# Patient Record
Sex: Female | Born: 1935 | Race: White | Marital: Single
Health system: Southern US, Community
[De-identification: ages and names within clinical notes are randomized; demographics above are authoritative.]

## PROBLEM LIST (undated history)

## (undated) HISTORY — PX: BREAST EXCISIONAL BIOPSY: SUR124

---

## 2017-02-22 ENCOUNTER — Other Ambulatory Visit: Payer: Self-pay | Admitting: Neurosurgery

## 2017-02-22 DIAGNOSIS — S12100D Unspecified displaced fracture of second cervical vertebra, subsequent encounter for fracture with routine healing: Secondary | ICD-10-CM

## 2017-02-28 ENCOUNTER — Ambulatory Visit
Admission: RE | Admit: 2017-02-28 | Discharge: 2017-02-28 | Disposition: A | Discharge status: Home / Self Care | Payer: Medicare Other | Source: Ambulatory Visit | Attending: Neurosurgery | Admitting: Neurosurgery

## 2017-02-28 DIAGNOSIS — S12100D Unspecified displaced fracture of second cervical vertebra, subsequent encounter for fracture with routine healing: Secondary | ICD-10-CM

## 2018-06-02 ENCOUNTER — Other Ambulatory Visit: Payer: Self-pay | Admitting: Family Medicine

## 2018-06-02 DIAGNOSIS — N6310 Unspecified lump in the right breast, unspecified quadrant: Secondary | ICD-10-CM

## 2018-06-09 ENCOUNTER — Other Ambulatory Visit: Payer: Medicare Other

## 2018-06-10 ENCOUNTER — Ambulatory Visit
Admission: RE | Admit: 2018-06-10 | Discharge: 2018-06-10 | Disposition: A | Discharge status: Home / Self Care | Payer: Medicare Other | Source: Ambulatory Visit | Attending: Family Medicine | Admitting: Family Medicine

## 2018-06-10 ENCOUNTER — Other Ambulatory Visit: Payer: Self-pay | Admitting: Family Medicine

## 2018-06-10 DIAGNOSIS — N6310 Unspecified lump in the right breast, unspecified quadrant: Secondary | ICD-10-CM

## 2018-06-13 ENCOUNTER — Other Ambulatory Visit: Payer: Self-pay | Admitting: Family Medicine

## 2018-06-13 DIAGNOSIS — N6310 Unspecified lump in the right breast, unspecified quadrant: Secondary | ICD-10-CM

## 2018-06-16 ENCOUNTER — Other Ambulatory Visit: Payer: Medicare Other

## 2018-06-17 ENCOUNTER — Inpatient Hospital Stay
Admission: RE | Admit: 2018-06-17 | Discharge: 2018-06-17 | Disposition: A | Discharge status: Home / Self Care | Payer: Medicare Other | Source: Ambulatory Visit | Attending: Family Medicine | Admitting: Family Medicine

## 2018-08-11 ENCOUNTER — Other Ambulatory Visit: Payer: Self-pay | Admitting: Geriatric Medicine

## 2018-08-11 DIAGNOSIS — N6310 Unspecified lump in the right breast, unspecified quadrant: Secondary | ICD-10-CM

## 2018-08-19 ENCOUNTER — Ambulatory Visit
Admission: RE | Admit: 2018-08-19 | Discharge: 2018-08-19 | Disposition: A | Discharge status: Home / Self Care | Payer: Medicare Other | Source: Ambulatory Visit | Attending: Family Medicine | Admitting: Family Medicine

## 2018-08-19 ENCOUNTER — Ambulatory Visit
Admission: RE | Admit: 2018-08-19 | Discharge: 2018-08-19 | Disposition: A | Discharge status: Home / Self Care | Payer: Medicare Other | Source: Ambulatory Visit | Attending: Geriatric Medicine | Admitting: Geriatric Medicine

## 2018-08-19 DIAGNOSIS — N6310 Unspecified lump in the right breast, unspecified quadrant: Secondary | ICD-10-CM

## 2018-09-11 ENCOUNTER — Inpatient Hospital Stay: Payer: Medicare Other | Attending: Hematology and Oncology | Admitting: Hematology and Oncology

## 2018-09-11 ENCOUNTER — Telehealth: Payer: Self-pay | Admitting: Hematology and Oncology

## 2018-09-11 DIAGNOSIS — E559 Vitamin D deficiency, unspecified: Secondary | ICD-10-CM | POA: Insufficient documentation

## 2018-09-11 DIAGNOSIS — K219 Gastro-esophageal reflux disease without esophagitis: Secondary | ICD-10-CM | POA: Insufficient documentation

## 2018-09-11 DIAGNOSIS — N183 Chronic kidney disease, stage 3 unspecified: Secondary | ICD-10-CM

## 2018-09-11 DIAGNOSIS — Z8673 Personal history of transient ischemic attack (TIA), and cerebral infarction without residual deficits: Secondary | ICD-10-CM | POA: Diagnosis not present

## 2018-09-11 DIAGNOSIS — M542 Cervicalgia: Secondary | ICD-10-CM

## 2018-09-11 DIAGNOSIS — N3281 Overactive bladder: Secondary | ICD-10-CM | POA: Insufficient documentation

## 2018-09-11 DIAGNOSIS — Z79811 Long term (current) use of aromatase inhibitors: Secondary | ICD-10-CM | POA: Diagnosis not present

## 2018-09-11 DIAGNOSIS — Z79899 Other long term (current) drug therapy: Secondary | ICD-10-CM | POA: Diagnosis not present

## 2018-09-11 DIAGNOSIS — F329 Major depressive disorder, single episode, unspecified: Secondary | ICD-10-CM | POA: Diagnosis not present

## 2018-09-11 DIAGNOSIS — C50511 Malignant neoplasm of lower-outer quadrant of right female breast: Secondary | ICD-10-CM | POA: Insufficient documentation

## 2018-09-11 DIAGNOSIS — F411 Generalized anxiety disorder: Secondary | ICD-10-CM

## 2018-09-11 DIAGNOSIS — F330 Major depressive disorder, recurrent, mild: Secondary | ICD-10-CM

## 2018-09-11 DIAGNOSIS — I10 Essential (primary) hypertension: Secondary | ICD-10-CM | POA: Diagnosis not present

## 2018-09-11 DIAGNOSIS — F339 Major depressive disorder, recurrent, unspecified: Secondary | ICD-10-CM | POA: Insufficient documentation

## 2018-09-11 DIAGNOSIS — I739 Peripheral vascular disease, unspecified: Secondary | ICD-10-CM

## 2018-09-11 DIAGNOSIS — Z7982 Long term (current) use of aspirin: Secondary | ICD-10-CM | POA: Diagnosis not present

## 2018-09-11 DIAGNOSIS — R269 Unspecified abnormalities of gait and mobility: Secondary | ICD-10-CM

## 2018-09-11 DIAGNOSIS — K829 Disease of gallbladder, unspecified: Secondary | ICD-10-CM

## 2018-09-11 DIAGNOSIS — Z170 Estrogen receptor positive status [ER+]: Secondary | ICD-10-CM | POA: Diagnosis not present

## 2018-09-11 MED ORDER — LETROZOLE 2.5 MG PO TABS: 2.5000 mg | ORAL_TABLET | Freq: Every day | ORAL | 3 refills | Status: DC

## 2018-09-11 NOTE — Progress Notes (Signed)
[facility] [place] NOTE  Patient Care Team: Raymondo Band, MD as PCP - General (Family Medicine)  CHIEF COMPLAINTS/PURPOSE OF CONSULTATION:  Newly diagnosed breast cancer  HISTORY OF PRESENTING ILLNESS:  Katelyn Sims 84 y.o. female is here because of recent diagnosis of right breast cancer.  Patient is suspicious mass in the right breast which was evaluated by mammogram and ultrasound.  The ultrasound revealed a 2.9 cm mass in the right breast that was ER PR positive HER-2 negative.  She was seen by surgery and was referred to [place] for multidisciplinary care and management.  Patient saw surgery who did not recommend lumpectomy given her age and comorbidities.  She was referred to [place] for discussion regarding palliative antiestrogen treatment options.  She is accompanied today by her son.  She is extremely hard of hearing and uses a wheelchair for ambulation.  She lives in an assisted living facility.  Her son is her primary caregiver.  I reviewed her records extensively and collaborated the history with the patient.  SUMMARY OF ONCOLOGIC HISTORY:   Malignant neoplasm of lower-outer quadrant of right breast of female, estrogen receptor positive ([place])   08/19/2018 Initial Diagnosis    Suspicious mass at 8:30 position right breast 2.9 cm, ultrasound-guided biopsy revealed grade 2-3 IDC, ER 95%, PR 95%, Ki-67 10%, HER-2 -1+, T2 NX stage IIa clinical stage    09/11/2018 Cancer Staging    Staging form: Breast, AJCC 8th Edition - Clinical: Stage IIA (cT2, cN0, cM0, G3, ER+, PR+, HER2-) - Signed by Nicholas Lose, MD on 09/11/2018      MEDICAL HISTORY:  Hypertension, CKD stage III, generalized anxiety disorder, major depression, overactive bladder, vitamin D deficiency, GERD, gallbladder disease, stroke, peripheral vascular disease SURGICAL HISTORY:Revision of knee joint SOCIAL HISTORY: FAMILY HISTORY: Mother deceased due to CHF and DVT no family history of  cancers  ALLERGIES:  has no allergies on file.  MEDICATIONS:  Current Outpatient Medications  Medication Sig Dispense Refill  . acetaminophen (TYLENOL) 325 MG tablet Take 650 mg by mouth every 6 (six) hours as needed.    Marland Kitchen aspirin 81 MG tablet Take 81 mg by mouth daily.    . calcium carbonate (TUMS - DOSED IN MG ELEMENTAL CALCIUM) 500 MG chewable tablet Chew 1 tablet by mouth 3 (three) times daily with meals.    . carboxymethylcellulose (REFRESH TEARS) 0.5 % SOLN Place 1 drop into both eyes 3 (three) times daily.    . ciprofloxacin (CIPRO) 500 MG tablet Take 500 mg by mouth every 12 (twelve) hours.    Marland Kitchen guaiFENesin (MUCINEX) 600 MG 12 hr tablet Take 600 mg by mouth 2 (two) times daily. X 7 days    . levothyroxine (SYNTHROID, LEVOTHROID) 88 MCG tablet Take 88 mcg by mouth daily before breakfast.    . loperamide (IMODIUM A-D) 2 MG tablet Take 2 mg by mouth 4 (four) times daily as needed for diarrhea or loose stools.    Marland Kitchen losartan-hydrochlorothiazide (HYZAAR) 50-12.5 MG tablet Take 2 tablets by mouth daily.    . Olopatadine HCl (PATADAY) 0.2 % SOLN Apply 2 drops to eye daily. Left Eye   X 10 days    . oxybutynin (DITROPAN) 5 MG tablet Take 5 mg by mouth 2 (two) times daily.    Marland Kitchen PARoxetine (PAXIL) 20 MG tablet Take 20 mg by mouth daily.    Marland Kitchen letrozole (FEMARA) 2.5 MG tablet Take 1 tablet (2.5 mg total) by mouth daily. 90 tablet  3   No current facility-administered medications for this visit.     REVIEW OF SYSTEMS:   Constitutional: Denies fevers, chills or abnormal night sweats Eyes: Denies blurriness of vision, double vision or watery eyes Ears, nose, mouth, throat, and face: Denies mucositis or sore throat Respiratory: Denies cough, dyspnea or wheezes Cardiovascular: Denies palpitation, chest discomfort or lower extremity swelling Gastrointestinal:  Denies nausea, heartburn or change in bowel habits Skin: Denies abnormal skin rashes Lymphatics: Denies new lymphadenopathy or easy  bruising Neurological:Denies numbness, tingling or new weaknesses Behavioral/Psych: Mood is stable, no new changes  Breast: Right breast lump All other systems were reviewed with the patient and are negative.  PHYSICAL EXAMINATION: ECOG PERFORMANCE STATUS: 1 - Symptomatic but completely ambulatory  Vitals:   09/11/18 1304  BP: 106/89  Pulse: 78  Resp: 18  Temp: (!) 97.3 F (36.3 C)  SpO2: 91%   Filed Weights    GENERAL:alert, no distress and comfortable SKIN: skin color, texture, turgor are normal, no rashes or significant lesions EYES: normal, conjunctiva are pink and non-injected, sclera clear OROPHARYNX:no exudate, no erythema and lips, buccal mucosa, and tongue normal  NECK: supple, thyroid normal size, non-tender, without nodularity LYMPH:  no palpable lymphadenopathy in the cervical, axillary or inguinal LUNGS: clear to auscultation and percussion with normal breathing effort HEART: regular rate & rhythm and no murmurs and no lower extremity edema ABDOMEN:abdomen soft, non-tender and normal bowel sounds Musculoskeletal:no cyanosis of digits and no clubbing  PSYCH: alert & oriented x 3 with fluent speech NEURO: no focal motor/sensory deficits BREAST: Palpable lump in the right breast. No palpable axillary or supraclavicular lymphadenopathy (exam performed in the presence of a chaperone)  Yes-man we may good enough calling to check on the status of her claim I am not able to do that all it says my account is blocked and unable to know if it has been approved by Legrand Como to get my phone and swollen shut I am sorry repeat that repeat that last part short just did not hear you say I should pay for the shipping of this fall or we will give was a paid shipment box OKAY perfect okay DM anticipated arrival okay sure RADIOGRAPHIC STUDIES: I have personally reviewed the radiological reports and agreed with the findings in the report.  ASSESSMENT AND PLAN:  Malignant neoplasm of  lower-outer quadrant of right breast of female, estrogen receptor positive ([place]) 08/19/2018: Suspicious mass at 8:30 position right breast 2.9 cm, ultrasound-guided biopsy revealed grade 2-3 IDC, ER 95%, PR 95%, Ki-67 10%, HER-2 -1+, T2 NX stage IIa clinical stage  Pathology and radiology counseling: Discussed with the patient, the details of pathology including the type of breast cancer,the clinical staging, the significance of ER, PR and HER-2/neu receptors and the implications for treatment.  Given the fact that the patient is extremely frail with multiple health issues, there is no role of definitive treatment.  Recommendation: Palliative antiestrogen therapy with letrozole 2.5 mg daily I sent a prescription to [facility].  However the patient's son is not entirely clear about the pharmacy and he may call us back with a different pharmacy. We discussed the risks and benefits of letrozole and she appears to understand these risks and is willing to proceed.  Return to clinic in 3 months for toxicity evaluation.   All questions were answered. The patient knows to call the clinic with any problems, questions or concerns.    Harriette Ohara, MD 09/11/18

## 2018-09-11 NOTE — Telephone Encounter (Signed)
Scheduled appt per 12/26 los - gave patient aVS and calender per los.

## 2018-09-11 NOTE — Assessment & Plan Note (Signed)
08/19/2018: Suspicious mass at 8:30 position right breast 2.9 cm, ultrasound-guided biopsy revealed grade 2-3 IDC, ER 95%, PR 95%, Ki-67 10%, HER-2 -1+, T2 NX stage IIa clinical stage  Pathology and radiology counseling: Discussed with the patient, the details of pathology including the type of breast cancer,the clinical staging, the significance of ER, PR and HER-2/neu receptors and the implications for treatment. After reviewing the pathology in detail, we proceeded to discuss the different treatment options between surgery, radiation, chemotherapy, antiestrogen therapies.  Recommendation: 1.  Breast conserving surgery 2.  Adjuvant radiation therapy 3.  Adjuvant antiestrogen therapy  Return to clinic 1 week after surgery to discuss pathology report

## 2018-09-15 ENCOUNTER — Other Ambulatory Visit: Payer: Self-pay

## 2018-09-15 MED ORDER — LETROZOLE 2.5 MG PO TABS: 2.5000 mg | ORAL_TABLET | Freq: Every day | ORAL | 3 refills | Status: AC

## 2018-09-15 NOTE — Telephone Encounter (Signed)
Return patient's son call regarding pharmacy change.  Son requesting mail order pharmacy to be changed to [facility].  Nurse changed pharmacy, new Rx sent.  [facility] notified to cancel original prescription.  Pt son is made aware of changes, no further needs at this time.

## 2018-11-18 ENCOUNTER — Emergency Department (HOSPITAL_COMMUNITY): Payer: Medicare Other

## 2018-11-18 ENCOUNTER — Emergency Department (HOSPITAL_COMMUNITY)
Admission: EM | Admit: 2018-11-18 | Discharge: 2018-11-18 | Payer: Medicare Other | Attending: Emergency Medicine | Admitting: Emergency Medicine

## 2018-11-18 DIAGNOSIS — S301XXA Contusion of abdominal wall, initial encounter: Secondary | ICD-10-CM | POA: Insufficient documentation

## 2018-11-18 DIAGNOSIS — I714 Abdominal aortic aneurysm, without rupture, unspecified: Secondary | ICD-10-CM

## 2018-11-18 DIAGNOSIS — F0390 Unspecified dementia without behavioral disturbance: Secondary | ICD-10-CM | POA: Diagnosis not present

## 2018-11-18 DIAGNOSIS — I129 Hypertensive chronic kidney disease with stage 1 through stage 4 chronic kidney disease, or unspecified chronic kidney disease: Secondary | ICD-10-CM | POA: Diagnosis not present

## 2018-11-18 DIAGNOSIS — Z79899 Other long term (current) drug therapy: Secondary | ICD-10-CM | POA: Insufficient documentation

## 2018-11-18 DIAGNOSIS — Z7982 Long term (current) use of aspirin: Secondary | ICD-10-CM | POA: Insufficient documentation

## 2018-11-18 DIAGNOSIS — Y92129 Unspecified place in nursing home as the place of occurrence of the external cause: Secondary | ICD-10-CM | POA: Diagnosis not present

## 2018-11-18 DIAGNOSIS — Y999 Unspecified external cause status: Secondary | ICD-10-CM | POA: Insufficient documentation

## 2018-11-18 DIAGNOSIS — X58XXXA Exposure to other specified factors, initial encounter: Secondary | ICD-10-CM | POA: Diagnosis not present

## 2018-11-18 DIAGNOSIS — Y939 Activity, unspecified: Secondary | ICD-10-CM | POA: Insufficient documentation

## 2018-11-18 DIAGNOSIS — R102 Pelvic and perineal pain: Secondary | ICD-10-CM | POA: Diagnosis present

## 2018-11-18 DIAGNOSIS — N183 Chronic kidney disease, stage 3 (moderate): Secondary | ICD-10-CM | POA: Diagnosis not present

## 2018-11-18 LAB — COMPREHENSIVE METABOLIC PANEL
ALT: 14 U/L (ref 0–44)
AST: 32 U/L (ref 15–41)
Albumin: 3.1 g/dL — ABNORMAL LOW (ref 3.5–5.0)
Alkaline Phosphatase: 69 U/L (ref 38–126)
Anion gap: 9 (ref 5–15)
BUN: 33 mg/dL — ABNORMAL HIGH (ref 8–23)
CO2: 25 mmol/L (ref 22–32)
Calcium: 9.2 mg/dL (ref 8.9–10.3)
Chloride: 102 mmol/L (ref 98–111)
Creatinine, Ser: 1.58 mg/dL — ABNORMAL HIGH (ref 0.44–1.00)
GFR calc non Af Amer: 29 mL/min — ABNORMAL LOW (ref >60)
GFR, EST AFRICAN AMERICAN: 33 mL/min — AB (ref >60)
Glucose, Bld: 97 mg/dL (ref 70–99)
Potassium: 4.1 mmol/L (ref 3.5–5.1)
Sodium: 136 mmol/L (ref 135–145)
Total Bilirubin: 0.9 mg/dL (ref 0.3–1.2)
Total Protein: 6.3 g/dL — ABNORMAL LOW (ref 6.5–8.1)

## 2018-11-18 LAB — CBC WITH DIFFERENTIAL/PLATELET
Abs Immature Granulocytes: 0.03 10*3/uL (ref 0.00–0.07)
Basophils Absolute: 0.1 10*3/uL (ref 0.0–0.1)
Basophils Relative: 1 %
Eosinophils Absolute: 0.1 10*3/uL (ref 0.0–0.5)
Eosinophils Relative: 1 %
HEMATOCRIT: 40 % (ref 36.0–46.0)
Hemoglobin: 12.2 g/dL (ref 12.0–15.0)
Immature Granulocytes: 0 %
Lymphocytes Relative: 23 %
Lymphs Abs: 2.1 10*3/uL (ref 0.7–4.0)
MCH: 28.3 pg (ref 26.0–34.0)
MCHC: 30.5 g/dL (ref 30.0–36.0)
MCV: 92.8 fL (ref 80.0–100.0)
Monocytes Absolute: 0.9 10*3/uL (ref 0.1–1.0)
Monocytes Relative: 10 %
Neutro Abs: 5.8 10*3/uL (ref 1.7–7.7)
Neutrophils Relative %: 65 %
Platelets: 345 10*3/uL (ref 150–400)
RBC: 4.31 MIL/uL (ref 3.87–5.11)
RDW: 14 % (ref 11.5–15.5)
WBC: 9 10*3/uL (ref 4.0–10.5)
nRBC: 0 % (ref 0.0–0.2)

## 2018-11-18 LAB — URINALYSIS, ROUTINE W REFLEX MICROSCOPIC
BACTERIA UA: NONE SEEN
Bilirubin Urine: NEGATIVE
Glucose, UA: NEGATIVE mg/dL
Ketones, ur: NEGATIVE mg/dL
Leukocytes,Ua: NEGATIVE
Nitrite: POSITIVE — AB
Protein, ur: NEGATIVE mg/dL
Specific Gravity, Urine: 1.016 (ref 1.005–1.030)
pH: 6 (ref 5.0–8.0)

## 2018-11-18 LAB — INFLUENZA PANEL BY PCR (TYPE A & B)
Influenza A By PCR: NEGATIVE
Influenza B By PCR: NEGATIVE

## 2018-11-18 LAB — LACTIC ACID, PLASMA: Lactic Acid, Venous: 1.2 mmol/L (ref 0.5–1.9)

## 2018-11-18 MED ORDER — SODIUM CHLORIDE 0.9 % IV BOLUS
1000.0000 mL | Freq: Once | INTRAVENOUS | Status: AC
  Administered 2018-11-18: 1000 mL via INTRAVENOUS

## 2018-11-18 MED ORDER — SODIUM CHLORIDE 0.9 % IV SOLN
1.0000 g | Freq: Once | INTRAVENOUS | Status: AC
  Administered 2018-11-18: 1 g via INTRAVENOUS
  Filled 2018-11-18: qty 10

## 2018-11-18 MED ORDER — TRAMADOL HCL 50 MG PO TABS: 50.0000 mg | ORAL_TABLET | Freq: Four times a day (QID) | ORAL | 0 refills | Status: AC | PRN

## 2018-11-18 NOTE — ED Notes (Addendum)
Attempted in and out cath x 2 w/o success; attempted by 1 RN and 1 EMT; purewick applied; Dr. Darl Householder aware

## 2018-11-18 NOTE — ED Provider Notes (Addendum)
[facility] EMERGENCY DEPARTMENT Provider Note   CSN: [id number] Arrival date & time: 11/18/18  1518    History   Chief Complaint Chief Complaint  Patient presents with  . Pelvic Pain    HPI Katelyn Sims is a 85 y.o. female history of dementia, hypertension, breast cancer in remission, here presenting with fever, cough, pelvic pain.  Patient states that she had some upper respiratory infection symptoms for the last week and was started on Z-Pak 2 days ago at the facility.  She states that she has been persistently coughing and has not been feeling well.  She states that she had poor appetite and they have not been feeding her that much.  She denies any vomiting or abdominal pain.  She did complain of some pelvic pain earlier today and apparently urinalysis was sent with results pending but she wanted to come to the ED for further evaluation.  Patient is demented and unable to give much detail of the history.  EMS noted that she was febrile to 103 and slightly tachycardic to 105 and blood pressure was in the low 100s. No meds prior to arrival.      The history is provided by the patient and the EMS personnel.  Level V caveat- dementia   No past medical history on file.  Patient Active Problem List   Diagnosis Date Noted  . Malignant neoplasm of lower-outer quadrant of right breast of female, estrogen receptor positive ([place]) 09/11/2018  . Essential hypertension, benign 09/11/2018  . Abnormality of gait 09/11/2018  . Chronic kidney disease, stage 3 (moderate) ([place]) 09/11/2018  . Cervicalgia 09/11/2018  . Generalized anxiety disorder 09/11/2018  . Major depressive disorder, recurrent episode ([place]) 09/11/2018  . Overactive bladder 09/11/2018  . Vitamin D deficiency 09/11/2018  . GERD (gastroesophageal reflux disease) 09/11/2018  . Disease of gallbladder, unspecified 09/11/2018    Past Surgical History:  Procedure Laterality Date  . BREAST EXCISIONAL BIOPSY Left       OB History   No obstetric history on file.      Home Medications    Prior to Admission medications   Medication Sig Start Date End Date Taking? Authorizing Provider  acetaminophen (TYLENOL) 325 MG tablet Take 650 mg by mouth every 6 (six) hours as needed.    [provider]  aspirin 81 MG tablet Take 81 mg by mouth daily.    [provider]  calcium carbonate (TUMS - DOSED IN MG ELEMENTAL CALCIUM) 500 MG chewable tablet Chew 1 tablet by mouth 3 (three) times daily with meals.    [provider]  carboxymethylcellulose (REFRESH TEARS) 0.5 % SOLN Place 1 drop into both eyes 3 (three) times daily.    [provider]  ciprofloxacin (CIPRO) 500 MG tablet Take 500 mg by mouth every 12 (twelve) hours.    [provider]  guaiFENesin (MUCINEX) 600 MG 12 hr tablet Take 600 mg by mouth 2 (two) times daily. X 7 days    [provider]  letrozole (FEMARA) 2.5 MG tablet Take 1 tablet (2.5 mg total) by mouth daily. 09/15/18   Nicholas Lose, MD  levothyroxine (SYNTHROID, LEVOTHROID) 88 MCG tablet Take 88 mcg by mouth daily before breakfast.    [provider]  loperamide (IMODIUM A-D) 2 MG tablet Take 2 mg by mouth 4 (four) times daily as needed for diarrhea or loose stools.    [provider]  losartan-hydrochlorothiazide (HYZAAR) 50-12.5 MG tablet Take 2 tablets by mouth  daily.    [provider]  Olopatadine HCl (PATADAY) 0.2 % SOLN Apply 2 drops to eye daily. Left Eye   X 10 days    [provider]  oxybutynin (DITROPAN) 5 MG tablet Take 5 mg by mouth 2 (two) times daily.    [provider]  PARoxetine (PAXIL) 20 MG tablet Take 20 mg by mouth daily.    [provider]    Family History No family history on file.  Social History Social History   Tobacco Use  . Smoking status: Not on file  Substance Use Topics  . Alcohol use: Not on file  . Drug use: Not on file      Allergies   Patient has no allergy information on record.   Review of Systems Review of Systems  Constitutional: Positive for chills, fatigue and fever.  Respiratory: Positive for cough.   All other systems reviewed and are negative.    Physical Exam Updated Vital Signs BP 105/87 (BP Location: Left Arm)   Pulse (!) 102   Temp 98.6 F (37 C) (Oral)   Resp 18   SpO2 96%   Physical Exam Vitals signs and nursing note reviewed.  Constitutional:      Comments: Chronically ill, dehydrated   HENT:     Head: Normocephalic.     Nose: Nose normal.     Mouth/Throat:     Mouth: Mucous membranes are dry.  Eyes:     Extraocular Movements: Extraocular movements intact.     Pupils: Pupils are equal, round, and reactive to light.  Neck:     Musculoskeletal: Normal range of motion.  Cardiovascular:     Rate and Rhythm: Normal rate and regular rhythm.  Pulmonary:     Effort: Pulmonary effort is normal.     Comments: Crackles L base  Abdominal:     General: Abdomen is flat.     Palpations: Abdomen is soft.  Musculoskeletal: Normal range of motion.  Skin:    General: Skin is warm.     Capillary Refill: Capillary refill takes less than 2 seconds.  Neurological:     General: No focal deficit present.     Mental Status: She is alert.  Psychiatric:        Mood and Affect: Mood normal.        Behavior: Behavior normal.      ED Treatments / Results  Labs (all labs ordered are listed, but only abnormal results are displayed) Labs Reviewed  CULTURE, BLOOD (ROUTINE X 2)  CULTURE, BLOOD (ROUTINE X 2)  URINE CULTURE  LACTIC ACID, PLASMA  LACTIC ACID, PLASMA  COMPREHENSIVE METABOLIC PANEL  CBC WITH DIFFERENTIAL/PLATELET  URINALYSIS, ROUTINE W REFLEX MICROSCOPIC  INFLUENZA PANEL BY PCR (TYPE A & B)    EKG None  Radiology No results found.  Procedures Procedures (including critical care time)  Medications Ordered in ED Medications  sodium chloride 0.9 % bolus  1,000 mL (has no administration in time range)     Initial Impression / Assessment and Plan / ED Course  I have reviewed the triage vital signs and the nursing notes.  Pertinent labs & imaging results that were available during my care of the patient were reviewed by me and considered in my medical decision making (see chart for details).       Katelyn Sims is a 85 y.o. female here with cough, fever. Febrile 103 per EMS. Concerned for possible UTI vs pneumonia vs flu. Will do  sepsis workup with cbc, cmp, lactate, cultures, UA, CXR, flu.   10:01 PM Patient is afebrile in the ED. Not hypotensive. Multiple attempts for in and out cath was unsuccessful but she was able to urinate and UA showed no bacteria. She has no leukocytosis, normal lactate. Cr is 1.6, no baseline. CT showed rectus sheath hematoma. I think that is likely causing her pain. She is no on blood thinners and she has an aneurysm with a stent. Her hemoglobin is 12 in the ED. I don't think she is septic currently. Her abdominal pain is likely from rectus sheath hematoma. Will dc back to facility with tramadol prn, tylenol.   10:28 PM I talked to Dr. Scot Dock from vascular regarding the aneurysm. He states that since she didn't have recent stent placement, it is unrelated to the rectus sheath aneurysm. No obvious leak from the stent observed. Vitals stable. Will dc back to facility.   Final Clinical Impressions(s) / ED Diagnoses   Final diagnoses:  None    ED Discharge Orders    None       Drenda Freeze, MD 11/18/18 2214    Drenda Freeze, MD 11/18/18 2229

## 2018-11-18 NOTE — ED Notes (Signed)
Reviewed d/c instructions with pt and receiving RN at facility, who verbalized understanding and had no outstanding questions. Pt departed in NAD, and in care of [facility] crew.

## 2018-11-18 NOTE — ED Triage Notes (Signed)
Pt here from [facility] skilled nursing facility c/o pelvic pain. Diagnosed with respiratory infection 1 week ago and receiving abx for same. EMS reports oral temp 103. Staff at facility unaware of patient's baseline.

## 2018-11-18 NOTE — Discharge Instructions (Addendum)
You have a hematoma on your abdominal wall muscles that is likely causing your pain.   You can continue tylenol as needed   You can take tramadol every 6 hrs as needed for severe pain.   Your urinalysis and chest xray and flu are negative right now. You can continue zpack as prescribed by your doctor  You have an aortic aneurysm that is stable. Follow up with Vascular surgery.   Return to ER if you have worse abdominal pain, vomiting, fever.

## 2018-11-18 NOTE — ED Notes (Signed)
Patient transported to CT 

## 2018-11-20 LAB — URINE CULTURE

## 2018-11-23 LAB — CULTURE, BLOOD (ROUTINE X 2): Culture: NO GROWTH

## 2018-12-11 ENCOUNTER — Ambulatory Visit: Payer: Medicare Other | Admitting: Hematology and Oncology

## 2019-02-16 DEATH — deceased

## 2019-07-07 IMAGING — MG MM CLIP PLACEMENT
2 series · 2 of 2 positions shown · non-contrast
Comparison: Previous exam(s).

CLINICAL DATA: Post biopsy mammogram of the right breast for clip
placement.

EXAM:
DIAGNOSTIC RIGHT MAMMOGRAM POST ULTRASOUND BIOPSY

[R ML]
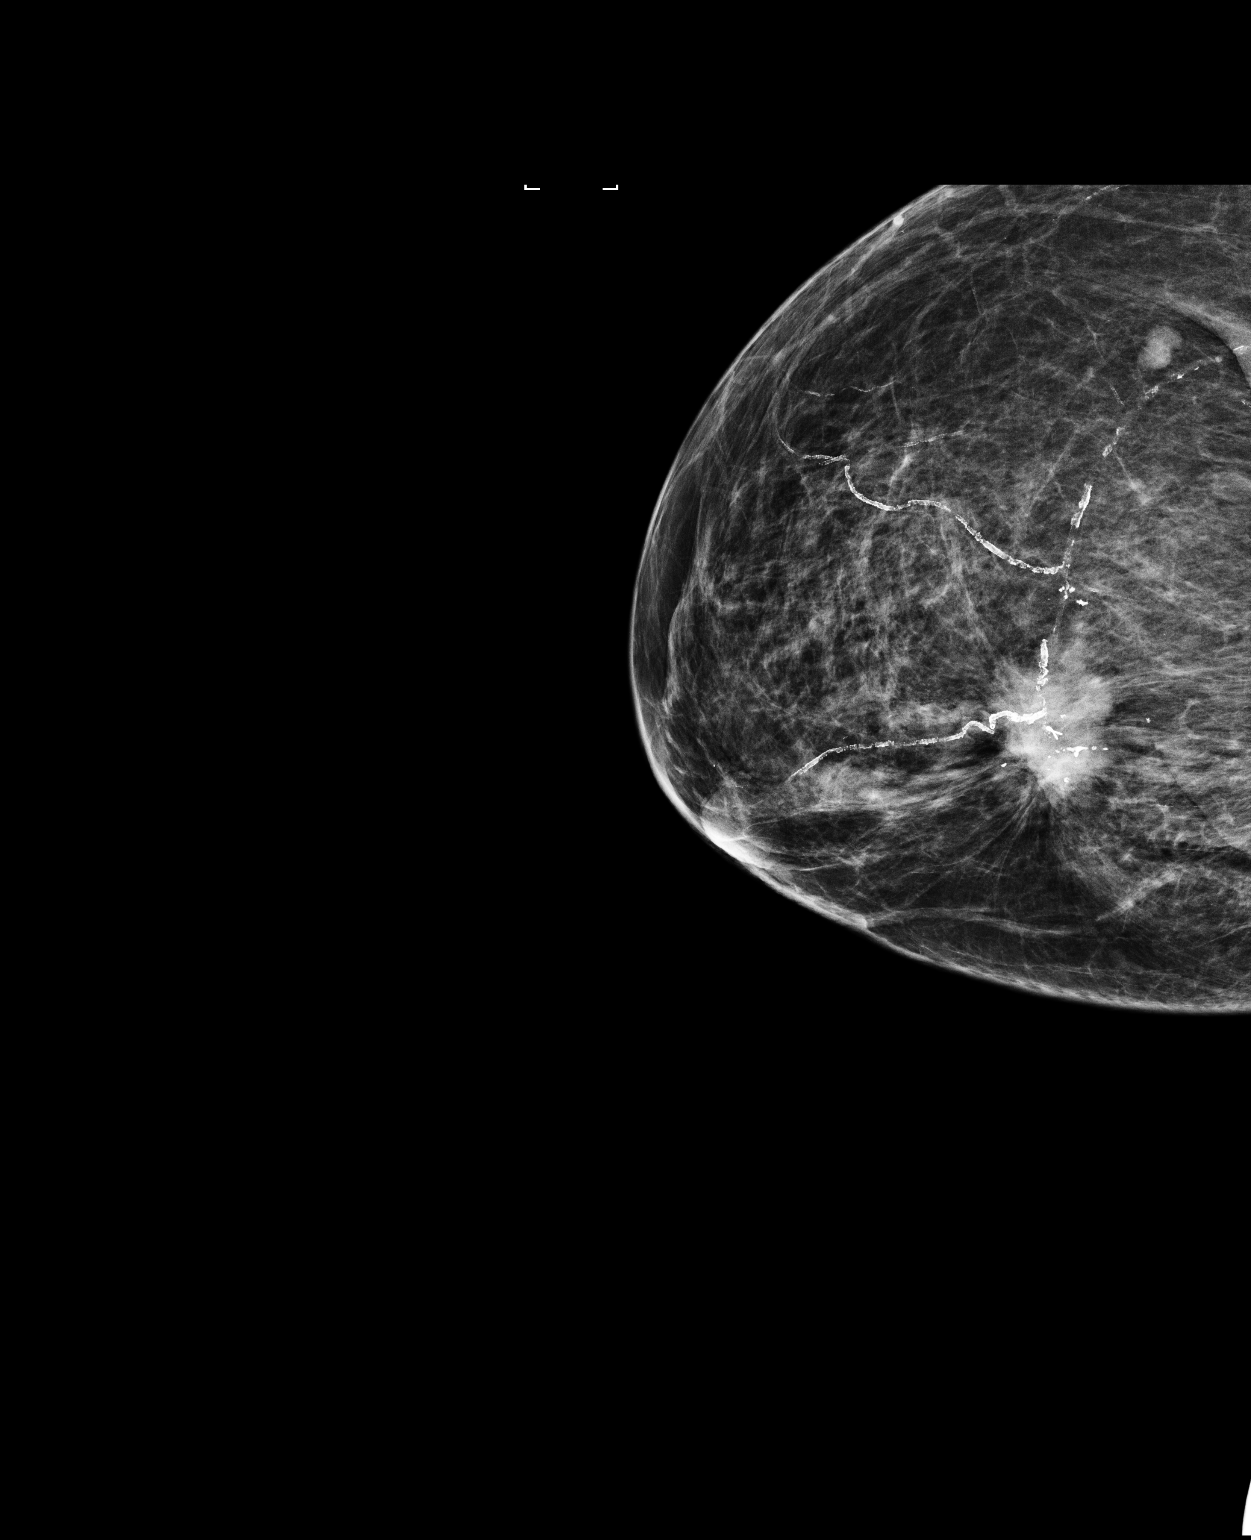

[R CC]
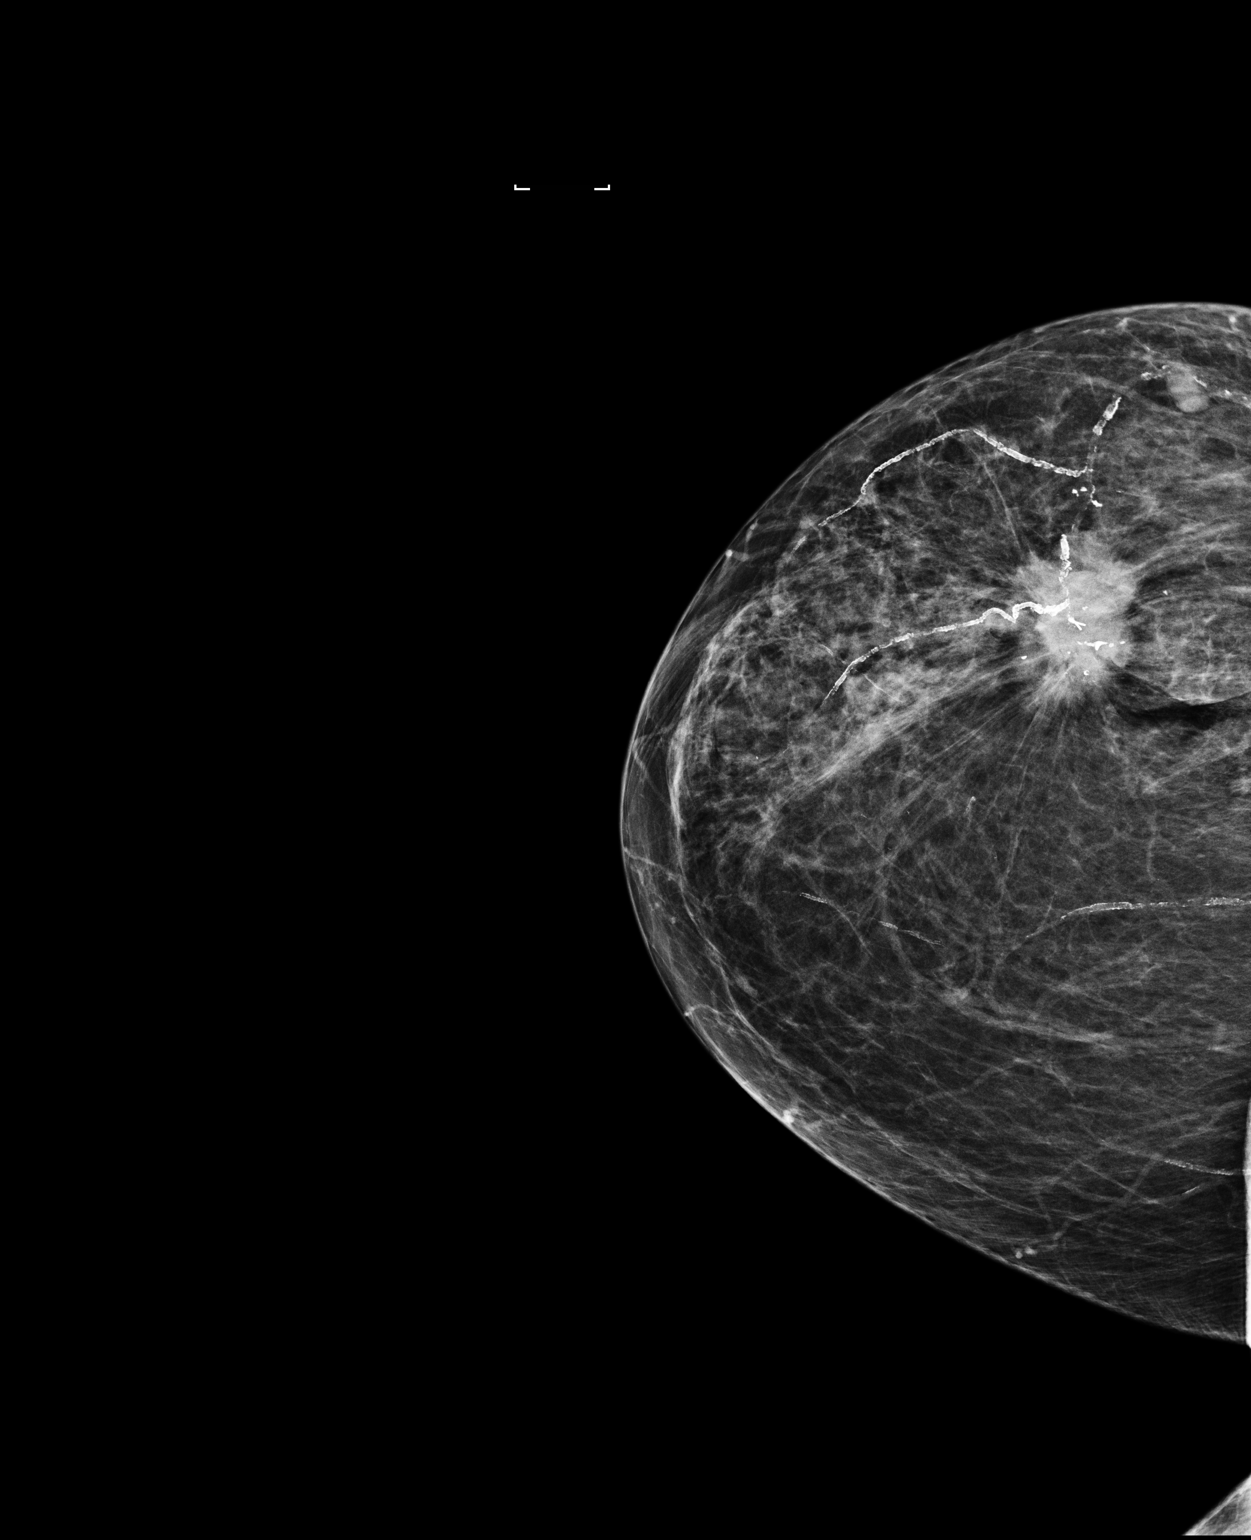

[2 of 2 positions shown; findings below may reference images not displayed]

FINDINGS: Mammographic images were obtained following ultrasound guided biopsy
of a mass in the slightly lower outer quadrant of the right breast.
The ribbon shaped biopsy marking clip is centrally positioned in the
biopsied mass in the lower outer right breast.
IMPRESSION: Appropriate positioning of the ribbon shaped biopsy marking clip
within the mass in the lower outer right breast.

Final Assessment: Post Procedure Mammograms for Marker Placement
